# Patient Record
Sex: Female | Born: 1990 | Race: Black or African American | Hispanic: No | Marital: Single | Smoking: Never smoker
Health system: Southern US, Community
[De-identification: ages and names within clinical notes are randomized; demographics above are authoritative.]

---

## 2010-11-07 ENCOUNTER — Other Ambulatory Visit (HOSPITAL_COMMUNITY)
Admission: RE | Admit: 2010-11-07 | Discharge: 2010-11-07 | Disposition: A | Discharge status: Home / Self Care | Payer: BC Managed Care – PPO | Source: Ambulatory Visit | Attending: Family Medicine | Admitting: Family Medicine

## 2010-11-07 DIAGNOSIS — Z124 Encounter for screening for malignant neoplasm of cervix: Secondary | ICD-10-CM | POA: Insufficient documentation

## 2013-11-19 ENCOUNTER — Other Ambulatory Visit (HOSPITAL_COMMUNITY)
Admission: RE | Admit: 2013-11-19 | Discharge: 2013-11-19 | Disposition: A | Discharge status: Home / Self Care | Payer: BC Managed Care – PPO | Source: Ambulatory Visit | Attending: Family Medicine | Admitting: Family Medicine

## 2013-11-19 DIAGNOSIS — Z01419 Encounter for gynecological examination (general) (routine) without abnormal findings: Secondary | ICD-10-CM | POA: Diagnosis present

## 2013-11-19 DIAGNOSIS — Z113 Encounter for screening for infections with a predominantly sexual mode of transmission: Secondary | ICD-10-CM | POA: Diagnosis present

## 2015-08-17 ENCOUNTER — Ambulatory Visit (INDEPENDENT_AMBULATORY_CARE_PROVIDER_SITE_OTHER): Payer: BC Managed Care – PPO | Admitting: Family Medicine

## 2015-08-17 VITALS — BP 122/72 | HR 72 | Temp 98.1°F | Resp 17 | Ht 73.0 in | Wt 212.0 lb

## 2015-08-17 DIAGNOSIS — R590 Localized enlarged lymph nodes: Secondary | ICD-10-CM

## 2015-08-17 DIAGNOSIS — R599 Enlarged lymph nodes, unspecified: Secondary | ICD-10-CM | POA: Diagnosis not present

## 2015-08-17 NOTE — Assessment & Plan Note (Signed)
Recommended labwork, ENT referral if no explained causes.  States that her PCP has already started this workout.  Defer management to PCP.

## 2015-08-17 NOTE — Progress Notes (Signed)
   Subjective:    Patient ID: Jasmine Hester, female    DOB: 1990/06/22, 25 y.o.   MRN: [id number]  HPI She noticed a lump in  a couple months ago and tonsils swollen per dentist.  She wanted a second opinion to see if she needed to see ENT after she went to her PCP.  She denies earache, nasal congestion, sore throat, fevers, chills, changes in weight, night sweats.  She has not noticed a significant increase in size of lump.  She has not noticed drainage or bleeding.      Review of Systems  Constitutional: Negative for chills, fatigue and fever.  HENT: Negative for congestion and rhinorrhea.   Respiratory: Negative for cough, chest tightness and shortness of breath.   Cardiovascular: Negative for chest pain and leg swelling.  Gastrointestinal: Negative for abdominal pain and nausea.  Genitourinary: Negative for dysuria and urgency.  Musculoskeletal: Negative for arthralgias and joint swelling.  Skin: Negative for rash and wound.  Psychiatric/Behavioral: Negative for agitation and confusion.  All other systems reviewed and are negative.      Objective:   Physical Exam  Constitutional: She is oriented to person, place, and time. She appears well-developed and well-nourished. No distress.  HENT:  Head: Normocephalic and atraumatic.  Right Ear: External ear normal.  Left Ear: External ear normal.  Neck: Normal range of motion. Neck supple. No thyromegaly present.  Mobile, uncomfortable but not tender 2 cm rubbery mass palpated along posterior cervical chain on left.  No overlying erythema or bleeding.  Pulmonary/Chest: Effort normal. No respiratory distress.  Musculoskeletal: Normal range of motion. She exhibits no edema.  Lymphadenopathy:    She has cervical adenopathy.  Neurological: She is alert and oriented to person, place, and time.  Skin: Skin is warm. No rash noted. She is not diaphoretic. No erythema.  Psychiatric: She has a normal mood and affect. Her behavior is normal.  Judgment and thought content normal.  Nursing note and vitals reviewed.  BP 122/72 (BP Location: Right Arm, Patient Position: Sitting, Cuff Size: Normal)   Pulse 72   Temp 98.1 F (36.7 C) (Oral)   Resp 17   Ht 6\' 1"  (1.854 m)   Wt 212 lb (96.2 kg)   LMP 08/17/2015 (Approximate)   SpO2 100%   BMI 27.97 kg/m         Assessment & Plan:  LAD (lymphadenopathy) of left cervical region Recommended labwork, ENT referral if no explained causes.  States that her PCP has already started this workout.  Defer management to PCP.    Signed,  Corliss MarcusAlicia Barnes, DO [facility]

## 2015-08-17 NOTE — Patient Instructions (Addendum)
   Please call if worsening symptoms.    IF you received an x-ray today, you will receive an invoice from [facility]. Please contact [facility] at [phone] with questions or concerns regarding your invoice.   IF you received labwork today, you will receive an invoice from [facility]. Please contact [facility] at [phone] with questions or concerns regarding your invoice.   Our billing staff will not be able to assist you with questions regarding bills from these companies.  You will be contacted with the lab results as soon as they are available. The fastest way to get your results is to activate your My Chart account. Instructions are located on the last page of this paperwork. If you have not heard from us regarding the results in 2 weeks, please contact this office.

## 2015-08-18 NOTE — Progress Notes (Signed)
Patient discussed and examined with Dr. Zachery DauerBarnes. Agree with assessment and plan of care per her note.   Signed,   Meredith StaggersJeffrey Krystalle Pilkington, MD [facility].  08/18/15 4:46 PM

## 2015-09-09 ENCOUNTER — Other Ambulatory Visit: Payer: Self-pay | Admitting: Otolaryngology

## 2015-09-09 DIAGNOSIS — R590 Localized enlarged lymph nodes: Secondary | ICD-10-CM

## 2015-09-20 ENCOUNTER — Encounter: Payer: Self-pay | Admitting: Radiology

## 2015-09-20 ENCOUNTER — Ambulatory Visit
Admission: RE | Admit: 2015-09-20 | Discharge: 2015-09-20 | Disposition: A | Discharge status: Home / Self Care | Payer: BC Managed Care – PPO | Source: Ambulatory Visit | Attending: Otolaryngology | Admitting: Otolaryngology

## 2015-09-20 DIAGNOSIS — R590 Localized enlarged lymph nodes: Secondary | ICD-10-CM

## 2015-09-20 IMAGING — CT CT NECK W/ CM
3 of 4 series · 7 of 14 positions shown, 8 images · IV contrast (iopamidol)
Comparison: None.

CLINICAL DATA: 25 y/o F; swollen area of the posterior left neck
marked with BILLIOT for 4 months.

EXAM:
CT NECK WITH CONTRAST
TECHNIQUE: Multidetector CT imaging of the neck was performed using the
standard protocol following the bolus administration of intravenous
contrast.
CONTRAST:  75mL [30] IOPAMIDOL ([30]) INJECTION 61%

[Series 2: neck · axial · 0.43mm/px · z∈[-230,-94]mm · 3 of 137 slices shown, 4 images]
[im 35/137  soft-tissue]
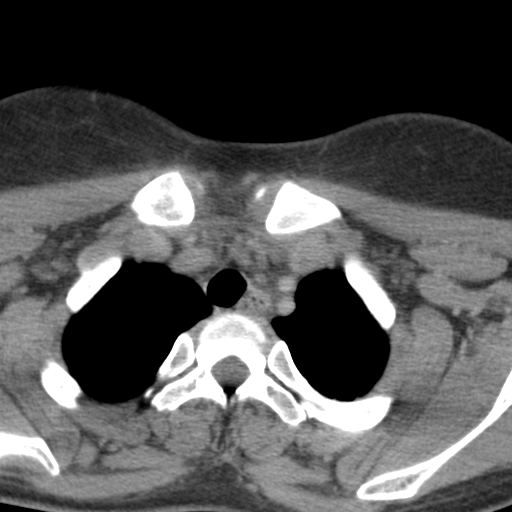
[im 35/137  bone]
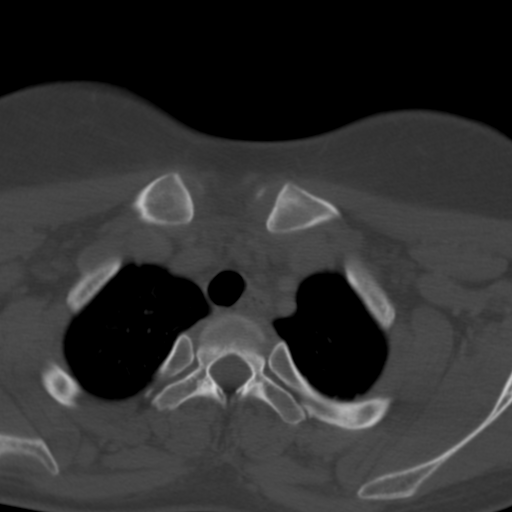
[im 69/137  bone]
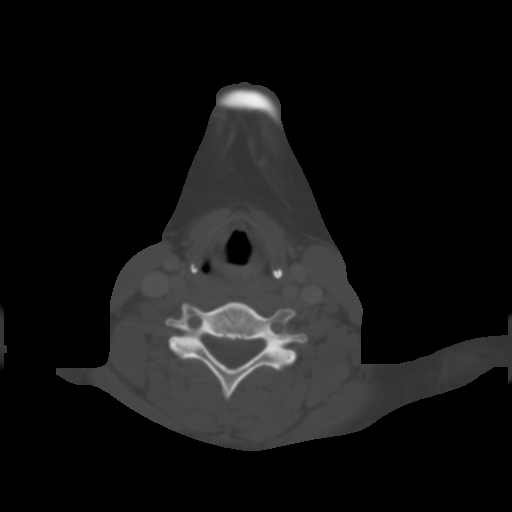
[im 103/137  bone]
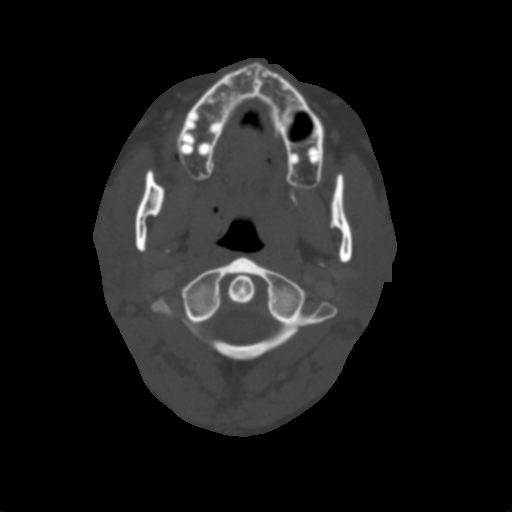

[Series 4: bone · axial · 0.43mm/px · z∈[-206,-116]mm · 2 of 135 slices shown]
[im 45/135  bone]
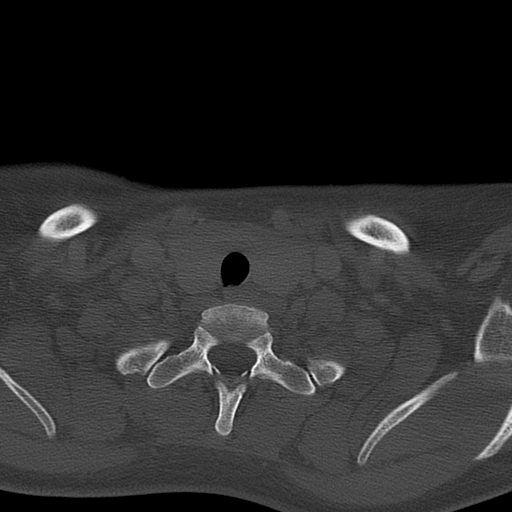
[im 90/135  bone]
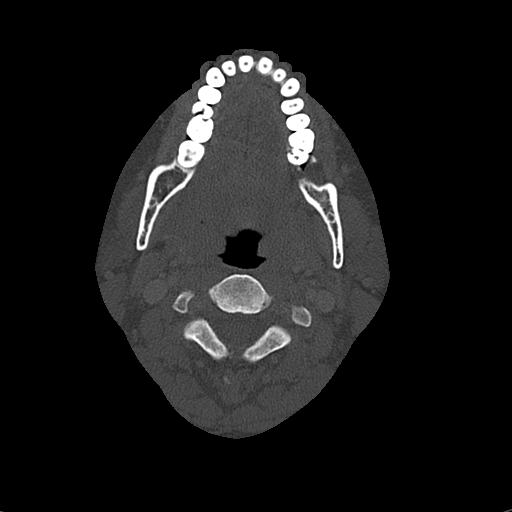

[Series 7: angled axial-oropharynx · axial · 0.42mm/px · z∈[-223,-134]mm · 2 of 135 slices shown]
[im 45/135  bone]
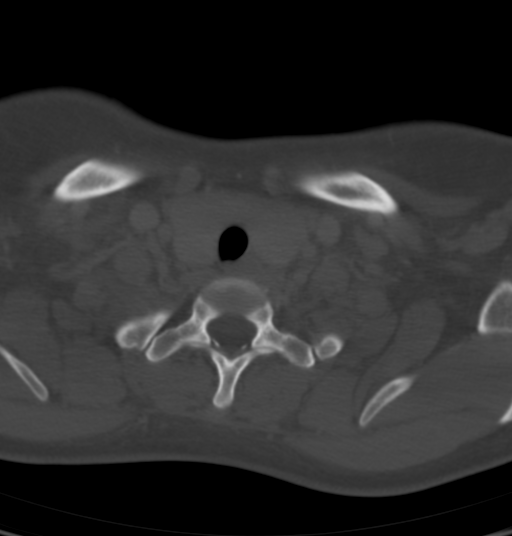
[im 90/135  bone]
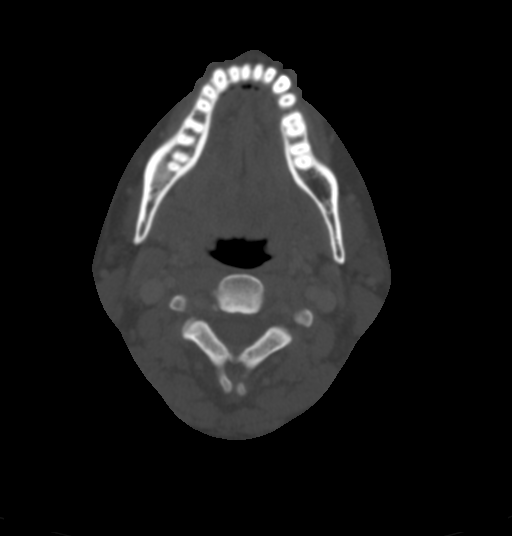

[7 of 14 positions shown; findings below may reference images not displayed]

FINDINGS: Pharynx and larynx: Patent aerodigestive tract. No discrete
exophytic mucosal lesion identified.

Salivary glands: No mass or stone.

Thyroid: No discrete mass identified.

Lymph nodes: There is lymphadenopathy of left posterior cervical
lymph nodes at the site of the marker at the area of interest
measuring up to 13 x 10 mm (series 2, image 58 and series 6, image
74). Additionally bilateral submandibular lymph nodes are mildly
enlarged. There is no abnormal enhancement/density of the lymph
nodes or cystic/necrotic changes.

Vascular: Negative.

Limited intracranial: No acute abnormality identified.

Visualized orbits: Negative.

Mastoids and visualized paranasal sinuses: Normally aerated.

Skeleton: No acute osseous abnormality or suspicious bony lesion
identified.

Upper chest: Clear lungs.
IMPRESSION: Nonspecific left posterior cervical lymphadenopathy corresponding to
the palpable area of interest. No mass or inflammatory process of
the neck as explanation for lymphadenopathy. Differential includes
local adenitis or a systemic infectious, inflammatory, or
lymphoproliferative process.

By: BILLIOT M.D.

## 2015-09-20 MED ORDER — IOPAMIDOL (ISOVUE-300) INJECTION 61%
75.0000 mL | Freq: Once | INTRAVENOUS | Status: AC | PRN
  Administered 2015-09-20: 75 mL via INTRAVENOUS

## 2017-04-09 ENCOUNTER — Other Ambulatory Visit: Payer: Self-pay | Admitting: Family Medicine

## 2017-04-09 ENCOUNTER — Other Ambulatory Visit (HOSPITAL_COMMUNITY)
Admission: RE | Admit: 2017-04-09 | Discharge: 2017-04-09 | Disposition: A | Discharge status: Home / Self Care | Payer: BC Managed Care – PPO | Source: Ambulatory Visit | Attending: Family Medicine | Admitting: Family Medicine

## 2017-04-09 DIAGNOSIS — Z01411 Encounter for gynecological examination (general) (routine) with abnormal findings: Secondary | ICD-10-CM | POA: Insufficient documentation

## 2017-04-11 LAB — CYTOLOGY - PAP: Diagnosis: NEGATIVE

## 2020-05-06 ENCOUNTER — Other Ambulatory Visit: Payer: Self-pay | Admitting: Family Medicine

## 2020-05-06 ENCOUNTER — Other Ambulatory Visit (HOSPITAL_COMMUNITY)
Admission: RE | Admit: 2020-05-06 | Discharge: 2020-05-06 | Disposition: A | Discharge status: Home / Self Care | Payer: BC Managed Care – PPO | Source: Ambulatory Visit | Attending: Family Medicine | Admitting: Family Medicine

## 2020-05-06 DIAGNOSIS — Z01411 Encounter for gynecological examination (general) (routine) with abnormal findings: Secondary | ICD-10-CM | POA: Insufficient documentation

## 2020-05-11 LAB — CYTOLOGY - PAP
Comment: NEGATIVE
Diagnosis: NEGATIVE
High risk HPV: NEGATIVE

## 2020-09-10 ENCOUNTER — Other Ambulatory Visit: Payer: Self-pay

## 2020-09-10 ENCOUNTER — Encounter: Payer: Self-pay | Admitting: Family Medicine

## 2020-09-10 ENCOUNTER — Ambulatory Visit: Payer: Self-pay | Admitting: Family Medicine

## 2020-09-10 DIAGNOSIS — A599 Trichomoniasis, unspecified: Secondary | ICD-10-CM

## 2020-09-10 DIAGNOSIS — Z113 Encounter for screening for infections with a predominantly sexual mode of transmission: Secondary | ICD-10-CM

## 2020-09-10 DIAGNOSIS — F419 Anxiety disorder, unspecified: Secondary | ICD-10-CM

## 2020-09-10 LAB — WET PREP FOR TRICH, YEAST, CLUE
Trichomonas Exam: POSITIVE — AB
Yeast Exam: NEGATIVE

## 2020-09-10 LAB — HM HIV SCREENING LAB: HM HIV Screening: NEGATIVE

## 2020-09-10 MED ORDER — METRONIDAZOLE 500 MG PO TABS: 500.0000 mg | ORAL_TABLET | Freq: Two times a day (BID) | ORAL | 0 refills | Status: AC

## 2020-09-10 NOTE — Progress Notes (Signed)
[facility] Department STI clinic/screening visit  Subjective:  Jasmine Hester is a 30 y.o. female being seen today for an STI screening visit. The patient reports they do have symptoms.  Patient reports that they do not desire a pregnancy in the next year.   They reported they are not interested in discussing contraception today.  Patient's last menstrual period was 09/01/2020.   Patient has the following medical conditions:   Patient Active Problem List   Diagnosis Date Noted   LAD (lymphadenopathy) of left cervical region 08/17/2015    Chief Complaint  Patient presents with   SEXUALLY TRANSMITTED DISEASE    Screening     HPI  Patient reports here for screening, has s/sx   Last HIV test per patient/review of record was 04/16/20 Patient reports last pap was 04/16/2020.   See flowsheet for further details and programmatic requirements.    The following portions of the patient's history were reviewed and updated as appropriate: allergies, current medications, past medical history, past social history, past surgical history and problem list.  Objective:  There were no vitals filed for this visit.  Physical Exam Vitals and nursing note reviewed.  Constitutional:      Appearance: Normal appearance.  HENT:     Head: Normocephalic and atraumatic.     Mouth/Throat:     Mouth: Mucous membranes are moist.     Pharynx: Oropharynx is clear. No oropharyngeal exudate or posterior oropharyngeal erythema.  Pulmonary:     Effort: Pulmonary effort is normal.  Abdominal:     General: Abdomen is flat.     Palpations: There is no mass.     Tenderness: There is no abdominal tenderness. There is no rebound.  Genitourinary:    General: Normal vulva.     Exam position: Lithotomy position.     Pubic Area: No rash or pubic lice.      Labia:        Right: No rash or lesion.        Left: No rash or lesion.      Vagina: Normal. No vaginal discharge, erythema, bleeding or  lesions.     Cervix: No cervical motion tenderness, discharge, friability, lesion or erythema.     Uterus: Normal.      Adnexa: Right adnexa normal and left adnexa normal.     Rectum: Normal.     Comments: External genitalia without, lice, nits, erythema, edema , lesions or inguinal adenopathy. Vagina with normal mucosa and discharge and pH <4. Pt has strawberry cervix.   Cervix without  visual lesions, uterus firm, mobile, non-tender, no masses, CMT adnexal fullness or tenderness.   Musculoskeletal:     Cervical back: Normal range of motion and neck supple.  Lymphadenopathy:     Head:     Right side of head: No preauricular or posterior auricular adenopathy.     Left side of head: No preauricular or posterior auricular adenopathy.     Cervical: No cervical adenopathy.     Upper Body:     Right upper body: No supraclavicular or axillary adenopathy.     Left upper body: No supraclavicular or axillary adenopathy.     Lower Body: No right inguinal adenopathy. No left inguinal adenopathy.  Skin:    General: Skin is warm and dry.     Findings: No rash.  Neurological:     Mental Status: She is alert and oriented to person, place, and time.  Psychiatric:  Mood and Affect: Mood normal.        Behavior: Behavior normal.     Assessment and Plan:  Jasmine Hester is a 30 y.o. female presenting to the [facility] Department for STI screening  1. Screening examination for venereal disease  - Chlamydia/Gonorrhea [facility] - HIV [facility] - Syphilis Serology, [place] - WET PREP FOR TRICH, YEAST, CLUE - Chlamydia/Gonorrhea [facility]  Patient accepted all screenings including wet prep, oral, vaginal CT/GC and bloodwork for HIV/RPR.  Patient meets criteria for HepB screening? No. Ordered? No - does not meet criteria  Patient meets criteria for HepC screening? No. Ordered? No - does not meet criteia    2. Anxiety Pt  reports history of anxiety and desires help  with coping  - Ambulatory referral to Behavioral Health  3. Trichimoniasis + wet prep for Trich and s/sx on assessment  - metroNIDAZOLE (FLAGYL) 500 MG tablet; Take 1 tablet (500 mg total) by mouth 2 (two) times daily for 7 days.  Dispense: 14 tablet; Refill: 0  Wet prep results + trich    Treatment needed for Trich  Discussed time line for [employer] and that patient will be called with positive results and encouraged patient to call if she had not heard in 2 weeks.  Counseled to return or seek care for continued or worsening symptoms Recommended condom use with all sex  Patient is currently using OCP's to prevent pregnancy.    No follow-ups on file.  No future appointments.  Wendi Snipes, FNP

## 2020-09-17 ENCOUNTER — Telehealth: Payer: Self-pay | Admitting: Family Medicine

## 2020-09-17 NOTE — Telephone Encounter (Signed)
Returning a call to [facility]. Was it about my test results?

## 2020-09-23 ENCOUNTER — Telehealth: Payer: Self-pay | Admitting: Family Medicine

## 2020-10-12 ENCOUNTER — Encounter: Payer: Self-pay | Admitting: Licensed Clinical Social Worker

## 2020-10-12 ENCOUNTER — Ambulatory Visit: Payer: Self-pay | Admitting: Licensed Clinical Social Worker

## 2020-10-12 DIAGNOSIS — Z0389 Encounter for observation for other suspected diseases and conditions ruled out: Secondary | ICD-10-CM
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
## 2020-12-15 DIAGNOSIS — F411 Generalized anxiety disorder: Secondary | ICD-10-CM | POA: Diagnosis not present

## 2020-12-22 DIAGNOSIS — F411 Generalized anxiety disorder: Secondary | ICD-10-CM | POA: Diagnosis not present

## 2021-01-26 DIAGNOSIS — F411 Generalized anxiety disorder: Secondary | ICD-10-CM | POA: Diagnosis not present

## 2021-02-23 DIAGNOSIS — F411 Generalized anxiety disorder: Secondary | ICD-10-CM | POA: Diagnosis not present

## 2021-05-13 DIAGNOSIS — N923 Ovulation bleeding: Secondary | ICD-10-CM | POA: Diagnosis not present

## 2021-05-13 DIAGNOSIS — Z0000 Encounter for general adult medical examination without abnormal findings: Secondary | ICD-10-CM | POA: Diagnosis not present

## 2021-07-17 DIAGNOSIS — R450 Nervousness: Secondary | ICD-10-CM | POA: Diagnosis not present

## 2021-07-17 DIAGNOSIS — R202 Paresthesia of skin: Secondary | ICD-10-CM | POA: Diagnosis not present

## 2021-07-17 DIAGNOSIS — Z888 Allergy status to other drugs, medicaments and biological substances status: Secondary | ICD-10-CM | POA: Diagnosis not present

## 2021-07-17 DIAGNOSIS — F419 Anxiety disorder, unspecified: Secondary | ICD-10-CM | POA: Diagnosis not present

## 2021-07-17 DIAGNOSIS — R200 Anesthesia of skin: Secondary | ICD-10-CM | POA: Diagnosis not present

## 2021-07-22 DIAGNOSIS — R531 Weakness: Secondary | ICD-10-CM | POA: Diagnosis not present

## 2021-07-26 DIAGNOSIS — R0602 Shortness of breath: Secondary | ICD-10-CM | POA: Diagnosis not present

## 2021-07-26 DIAGNOSIS — R42 Dizziness and giddiness: Secondary | ICD-10-CM | POA: Diagnosis not present

## 2021-07-26 DIAGNOSIS — R0600 Dyspnea, unspecified: Secondary | ICD-10-CM | POA: Diagnosis not present

## 2021-08-04 DIAGNOSIS — R531 Weakness: Secondary | ICD-10-CM | POA: Diagnosis not present

## 2021-08-04 DIAGNOSIS — R200 Anesthesia of skin: Secondary | ICD-10-CM | POA: Diagnosis not present

## 2021-08-25 DIAGNOSIS — R209 Unspecified disturbances of skin sensation: Secondary | ICD-10-CM | POA: Diagnosis not present

## 2021-09-23 DIAGNOSIS — R232 Flushing: Secondary | ICD-10-CM | POA: Diagnosis not present

## 2021-09-23 DIAGNOSIS — R42 Dizziness and giddiness: Secondary | ICD-10-CM | POA: Diagnosis not present

## 2021-09-23 DIAGNOSIS — E538 Deficiency of other specified B group vitamins: Secondary | ICD-10-CM | POA: Diagnosis not present

## 2021-09-23 DIAGNOSIS — E669 Obesity, unspecified: Secondary | ICD-10-CM | POA: Diagnosis not present
